# Patient Record
Sex: Male | Born: 1990 | Hispanic: Yes | Marital: Single | Smoking: Never smoker
Health system: Southern US, Community
[De-identification: ages and names within clinical notes are randomized; demographics above are authoritative.]

---
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
## 2013-02-19 ENCOUNTER — Emergency Department: Payer: Self-pay | Admitting: Unknown Physician Specialty

## 2015-05-16 ENCOUNTER — Emergency Department: Payer: Self-pay

## 2015-05-16 ENCOUNTER — Encounter: Payer: Self-pay | Admitting: Radiology

## 2015-05-16 ENCOUNTER — Emergency Department
Admission: EM | Admit: 2015-05-16 | Discharge: 2015-05-17 | Disposition: A | Discharge status: Home / Self Care | Payer: Self-pay | Attending: Emergency Medicine | Admitting: Emergency Medicine

## 2015-05-16 DIAGNOSIS — S63601A Unspecified sprain of right thumb, initial encounter: Secondary | ICD-10-CM | POA: Insufficient documentation

## 2015-05-16 DIAGNOSIS — S51012A Laceration without foreign body of left elbow, initial encounter: Secondary | ICD-10-CM | POA: Insufficient documentation

## 2015-05-16 DIAGNOSIS — S0990XA Unspecified injury of head, initial encounter: Secondary | ICD-10-CM | POA: Insufficient documentation

## 2015-05-16 DIAGNOSIS — T148XXA Other injury of unspecified body region, initial encounter: Secondary | ICD-10-CM

## 2015-05-16 DIAGNOSIS — Y92410 Unspecified street and highway as the place of occurrence of the external cause: Secondary | ICD-10-CM | POA: Insufficient documentation

## 2015-05-16 DIAGNOSIS — S61411A Laceration without foreign body of right hand, initial encounter: Secondary | ICD-10-CM | POA: Insufficient documentation

## 2015-05-16 DIAGNOSIS — Y9389 Activity, other specified: Secondary | ICD-10-CM | POA: Insufficient documentation

## 2015-05-16 DIAGNOSIS — T07XXXA Unspecified multiple injuries, initial encounter: Secondary | ICD-10-CM

## 2015-05-16 DIAGNOSIS — S53402A Unspecified sprain of left elbow, initial encounter: Secondary | ICD-10-CM | POA: Insufficient documentation

## 2015-05-16 DIAGNOSIS — Y998 Other external cause status: Secondary | ICD-10-CM | POA: Insufficient documentation

## 2015-05-16 DIAGNOSIS — IMO0002 Reserved for concepts with insufficient information to code with codable children: Secondary | ICD-10-CM

## 2015-05-16 DIAGNOSIS — S50812A Abrasion of left forearm, initial encounter: Secondary | ICD-10-CM | POA: Insufficient documentation

## 2015-05-16 LAB — COMPREHENSIVE METABOLIC PANEL
ALBUMIN: 4.7 g/dL (ref 3.5–5.0)
ALT: 48 U/L (ref 17–63)
ANION GAP: 3 — AB (ref 5–15)
AST: 47 U/L — ABNORMAL HIGH (ref 15–41)
Alkaline Phosphatase: 83 U/L (ref 38–126)
BUN: 20 mg/dL (ref 6–20)
CALCIUM: 8.9 mg/dL (ref 8.9–10.3)
CO2: 29 mmol/L (ref 22–32)
Chloride: 107 mmol/L (ref 101–111)
Creatinine, Ser: 1.2 mg/dL (ref 0.61–1.24)
GFR calc Af Amer: >60 mL/min (ref >60)
GFR calc non Af Amer: >60 mL/min (ref >60)
GLUCOSE: 127 mg/dL — AB (ref 65–99)
Potassium: 3.5 mmol/L (ref 3.5–5.1)
SODIUM: 139 mmol/L (ref 135–145)
TOTAL PROTEIN: 7.2 g/dL (ref 6.5–8.1)
Total Bilirubin: 0.5 mg/dL (ref 0.3–1.2)

## 2015-05-16 LAB — CBC WITH DIFFERENTIAL/PLATELET
Basophils Absolute: 0 10*3/uL (ref 0–0.1)
Basophils Relative: 0 %
EOS ABS: 0.3 10*3/uL (ref 0–0.7)
Eosinophils Relative: 3 %
HCT: 45.4 % (ref 40.0–52.0)
HEMOGLOBIN: 15.5 g/dL (ref 13.0–18.0)
LYMPHS ABS: 2.4 10*3/uL (ref 1.0–3.6)
LYMPHS PCT: 24 %
MCH: 30 pg (ref 26.0–34.0)
MCHC: 34.2 g/dL (ref 32.0–36.0)
MCV: 87.9 fL (ref 80.0–100.0)
MONOS PCT: 7 %
Monocytes Absolute: 0.7 10*3/uL (ref 0.2–1.0)
NEUTROS ABS: 6.5 10*3/uL (ref 1.4–6.5)
NEUTROS PCT: 66 %
PLATELETS: 228 10*3/uL (ref 150–440)
RBC: 5.16 MIL/uL (ref 4.40–5.90)
RDW: 13 % (ref 11.5–14.5)
WBC: 10 10*3/uL (ref 3.8–10.6)

## 2015-05-16 LAB — LIPASE, BLOOD: LIPASE: 29 U/L (ref 22–51)

## 2015-05-16 MED ORDER — MORPHINE SULFATE 4 MG/ML IJ SOLN
4.0000 mg | INTRAMUSCULAR | Status: AC
  Administered 2015-05-16: 4 mg via INTRAVENOUS

## 2015-05-16 MED ORDER — SODIUM CHLORIDE 0.9 % IV BOLUS (SEPSIS)
1000.0000 mL | Freq: Once | INTRAVENOUS | Status: AC
  Administered 2015-05-16: 1000 mL via INTRAVENOUS

## 2015-05-16 MED ORDER — LIDOCAINE-EPINEPHRINE (PF) 1 %-1:200000 IJ SOLN
INTRAMUSCULAR | Status: AC
  Administered 2015-05-16: 23:00:00
  Filled 2015-05-16: qty 30

## 2015-05-16 MED ORDER — ONDANSETRON HCL 4 MG/2ML IJ SOLN
INTRAMUSCULAR | Status: AC
  Filled 2015-05-16: qty 2

## 2015-05-16 MED ORDER — MORPHINE SULFATE 4 MG/ML IJ SOLN
INTRAMUSCULAR | Status: AC
  Filled 2015-05-16: qty 1

## 2015-05-16 MED ORDER — BACITRACIN ZINC 500 UNIT/GM EX OINT
TOPICAL_OINTMENT | CUTANEOUS | Status: AC
  Filled 2015-05-16: qty 1.8

## 2015-05-16 MED ORDER — ONDANSETRON HCL 4 MG/2ML IJ SOLN
4.0000 mg | Freq: Once | INTRAMUSCULAR | Status: AC
  Administered 2015-05-16: 4 mg via INTRAVENOUS

## 2015-05-16 MED ORDER — IOHEXOL 300 MG/ML  SOLN
125.0000 mL | Freq: Once | INTRAMUSCULAR | Status: AC | PRN
  Administered 2015-05-16: 125 mL via INTRAVENOUS
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
## 2015-05-16 NOTE — ED Notes (Signed)
Pt in CT.

## 2015-05-16 NOTE — Discharge Instructions (Signed)
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
## 2015-05-17 ENCOUNTER — Emergency Department: Payer: Self-pay

## 2015-05-17 MED ORDER — CEPHALEXIN 500 MG PO CAPS
ORAL_CAPSULE | ORAL | Status: AC
  Administered 2015-05-17: 500 mg via ORAL
  Filled 2015-05-17: qty 1

## 2015-05-17 MED ORDER — HYDROCODONE-ACETAMINOPHEN 5-325 MG PO TABS: 1.0000 | ORAL_TABLET | ORAL | Status: AC | PRN

## 2015-05-17 MED ORDER — IBUPROFEN 800 MG PO TABS: 800.0000 mg | ORAL_TABLET | Freq: Three times a day (TID) | ORAL | Status: AC | PRN

## 2015-05-17 MED ORDER — KETOROLAC TROMETHAMINE 30 MG/ML IJ SOLN
INTRAMUSCULAR | Status: AC
  Administered 2015-05-17: 30 mg via INTRAVENOUS
  Filled 2015-05-17: qty 1

## 2015-05-17 MED ORDER — CEPHALEXIN 500 MG PO CAPS: 500.0000 mg | ORAL_CAPSULE | Freq: Four times a day (QID) | ORAL | Status: AC

## 2015-05-17 MED ORDER — KETOROLAC TROMETHAMINE 30 MG/ML IJ SOLN
30.0000 mg | Freq: Once | INTRAMUSCULAR | Status: AC
  Administered 2015-05-17: 30 mg via INTRAVENOUS

## 2015-05-17 MED ORDER — CEPHALEXIN 500 MG PO CAPS
500.0000 mg | ORAL_CAPSULE | Freq: Once | ORAL | Status: AC
  Administered 2015-05-17: 500 mg via ORAL

## 2015-05-17 NOTE — ED Notes (Signed)
Left elbow laceration repaired by Dr. Pershing Proud,  Bacitracin and bandaid applied,  Pt c/o worsening and and wound continued to bleed soaking through the bandaid.   Dr Ladona Ridgel was notified and she went to bedside to re-eval.

## 2015-05-17 NOTE — ED Notes (Signed)
Pain is 14 per pt given po and iv meds

## 2015-05-17 NOTE — ED Provider Notes (Signed)
-----------------------------------------   2:46 AM on 05/17/2015 -----------------------------------------  Reevaluation patient's left elbow had gotten significantly swollen and he was having much increased pain. We decided to send him back and have an x-ray of the left elbow but it was negative for acute fracture. Patient is to continue wound care to his left arm and left elbow which significantly had a large amount of abrasion as well as a laceration. We will place the patient on antibiotics to make sure that none of these wounds can impacted as well as Jenel Lucks give him hydrocodone and ibuprofen to take at home for pain. Patient had a thumb spica placed to his right thumb which did not show fracture but had some pretty significant for small ligamentous injury. Patient will be sent to orthopedics in follow-up.  Ferdinand Lango, personally viewed and evaluated these images as part of my medical decision making.      Leona Carry, MD 05/17/15 [phone]
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
## 2023-05-19 ENCOUNTER — Ambulatory Visit
Admission: EM | Admit: 2023-05-19 | Discharge: 2023-05-19 | Disposition: A | Discharge status: Home / Self Care | Payer: Self-pay | Attending: Emergency Medicine | Admitting: Emergency Medicine

## 2023-05-19 DIAGNOSIS — N481 Balanitis: Secondary | ICD-10-CM

## 2023-05-19 MED ORDER — CLOTRIMAZOLE-BETAMETHASONE 1-0.05 % EX CREA: TOPICAL_CREAM | CUTANEOUS | 0 refills | Status: AC

## 2023-05-19 NOTE — ED Provider Notes (Signed)
[facility]    CSN: [id number] Arrival date & time: 05/19/23  1924      History   Chief Complaint Chief Complaint  Patient presents with   Penile Issue   SEXUALLY TRANSMITTED DISEASE    HPI Javier Rhodes is a 32 y.o. male.   32 year old male patient, Javier Rhodes, presents to urgent care for evaluation of penile issue.  Patient states he has had scabs and sores on his penis and has been using a body scrub brush when bathing and thinks he has irritated his penis.  Patient denies new sexual partner, wears condoms with sexual activity.  Pt is uncircumcised  The history is provided by the patient. No language interpreter was used.    History reviewed. No pertinent past medical history.  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 No pertinent surgical history.     Home Medications    Prior to Admission medications   Medication Sig Start Date End Date Taking? Authorizing Provider  clotrimazole-betamethasone (LOTRISONE) cream Apply to affected area 2 times daily x 1 week 05/19/23  Yes Conchetta Lamia, Para March, NP  HYDROcodone-acetaminophen (NORCO) 5-325 MG per tablet Take 1 tablet by mouth every 4 (four) hours as needed for moderate pain. 05/17/15   Leona Carry, MD  ibuprofen (ADVIL,MOTRIN) 800 MG tablet Take 1 tablet (800 mg total) by mouth every 8 (eight) hours as needed. 05/17/15   Leona Carry, MD    Family History History reviewed. No pertinent family history.  Social History Social History   Tobacco Use   Smoking status: Never   Smokeless tobacco: Never  Vaping Use   Vaping Use: Some days  Substance Use Topics   Alcohol use: Yes    Comment: Occ   Drug use: Never     Allergies   Patient has no known allergies.   Review of Systems Review of Systems  Constitutional:  Negative for fever.  Genitourinary:  Positive for dysuria, genital sores and penile pain. Negative for  difficulty urinating, penile discharge, penile swelling, scrotal swelling and testicular pain.  All other systems reviewed and are negative.    Physical Exam Triage Vital Signs ED Triage Vitals  Enc Vitals Group     BP 05/19/23 1928 124/84     Pulse Rate 05/19/23 1928 94     Resp 05/19/23 1928 16     Temp 05/19/23 1928 98.8 F (37.1 C)     Temp Source 05/19/23 1928 Oral     SpO2 05/19/23 1928 94 %     Weight 05/19/23 1928 (!) 310 lb (140.6 kg)     Height 05/19/23 1928 5\' 11"  (1.803 m)     Head Circumference --      Peak Flow --      Pain Score 05/19/23 1931 10     Pain Loc --      Pain Edu? --      Excl. in GC? --    No data found.  Updated Vital Signs BP 124/84 (BP Location: Right Arm)   Pulse 94   Temp 98.8 F (37.1 C) (Oral)   Resp 16   Ht 5\' 11"  (1.803 m)   Wt (!) 310 lb (140.6 kg)   SpO2 94%   BMI 43.24 kg/m   Visual Acuity Right Eye Distance:   Left Eye Distance:   Bilateral Distance:    Right Eye Near:   Left Eye Near:  Bilateral Near:     Physical Exam Vitals and nursing note reviewed. Exam conducted with a chaperone present.  Genitourinary:    Penis: Uncircumcised. Erythema and lesions present.      Testes: Normal.     Epididymis:     Right: Normal.     Left: Normal.     Comments: Under shaft of penis/foreskin,erythema noted,no purulent discharge  Jeremy,NP, evaluated pt and agrees with assessment/exam findings   Neurological:     General: No focal deficit present.     Mental Status: He is alert and oriented to person, place, and time.     GCS: GCS eye subscore is 4. GCS verbal subscore is 5. GCS motor subscore is 6.  Psychiatric:        Attention and Perception: Attention normal.        Mood and Affect: Mood normal.        Speech: Speech normal.      UC Treatments / Results  Labs (all labs ordered are listed, but only abnormal results are displayed) Labs Reviewed  CYTOLOGY, (ORAL, ANAL, URETHRAL) ANCILLARY ONLY     EKG   Radiology No results found.  Procedures Procedures (including critical care time)  Medications Ordered in UC Medications - No data to display  Initial Impression / Assessment and Plan / UC Course  I have reviewed the triage vital signs and the nursing notes.  Pertinent labs & imaging results that were available during my care of the patient were reviewed by me and considered in my medical decision making (see chart for details).     Ddx: Balanitis, yeast infection,phimosis,allergies,STI Final Clinical Impressions(s) / UC Diagnoses   Final diagnoses:  Balanitis     Discharge Instructions      Avoid sexual activity until results are known, wear condoms with all future sexual activity. Use lotrisone as directed, follow up with PCP,urologist if symptoms persist or worsen. Stop scrubbing the area, wash and retract foreskin daily.     ED Prescriptions     Medication Sig Dispense Auth. Provider   clotrimazole-betamethasone (LOTRISONE) cream Apply to affected area 2 times daily x 1 week 15 g Nyssa Sayegh, Para March, NP      PDMP not reviewed this encounter.   Clancy Gourd, NP 05/19/23 2043

## 2023-05-19 NOTE — ED Triage Notes (Signed)
Pt c/o scabs in penis,redness & pain x1 wk. Denies any discharge. Concerned about poss STD.

## 2023-05-19 NOTE — Discharge Instructions (Addendum)
Avoid sexual activity until results are known, wear condoms with all future sexual activity. Use lotrisone as directed, follow up with PCP,urologist if symptoms persist or worsen. Stop scrubbing the area, wash and retract foreskin daily.

## 2023-05-21 LAB — CYTOLOGY, (ORAL, ANAL, URETHRAL) ANCILLARY ONLY
Chlamydia: NEGATIVE
Comment: NEGATIVE
Comment: NEGATIVE
Comment: NORMAL
Neisseria Gonorrhea: NEGATIVE
Trichomonas: NEGATIVE

## 2023-05-29 ENCOUNTER — Ambulatory Visit
Admission: EM | Admit: 2023-05-29 | Discharge: 2023-05-29 | Disposition: A | Discharge status: Home / Self Care | Payer: Self-pay | Attending: Physician Assistant | Admitting: Physician Assistant

## 2023-05-29 DIAGNOSIS — E1165 Type 2 diabetes mellitus with hyperglycemia: Secondary | ICD-10-CM

## 2023-05-29 DIAGNOSIS — H538 Other visual disturbances: Secondary | ICD-10-CM

## 2023-05-29 DIAGNOSIS — R5383 Other fatigue: Secondary | ICD-10-CM

## 2023-05-29 DIAGNOSIS — R739 Hyperglycemia, unspecified: Secondary | ICD-10-CM

## 2023-05-29 DIAGNOSIS — M791 Myalgia, unspecified site: Secondary | ICD-10-CM

## 2023-05-29 DIAGNOSIS — R42 Dizziness and giddiness: Secondary | ICD-10-CM

## 2023-05-29 LAB — GLUCOSE, CAPILLARY: Glucose-Capillary: 430 mg/dL — ABNORMAL HIGH (ref 70–99)

## 2023-05-29 NOTE — ED Notes (Signed)
Patient is being discharged from the Urgent Care and sent to the Emergency Department via [facility] . Per provider Nehemiah Settle, patient is in need of higher level of care due to high sugar level with blurry vision. Patient is aware and verbalizes understanding of plan of care.  Vitals:   05/29/23 1345  BP: 135/85  Pulse: 95  Resp: 18  Temp: 99.3 F (37.4 C)  SpO2: 100%

## 2023-05-29 NOTE — ED Triage Notes (Signed)
Pt reports fatigue, visual changes and t headache x 3 days. Pt reports family history of diabetes,

## 2023-05-29 NOTE — Discharge Instructions (Addendum)
-  Your blood sugar today is 430.  As discussed normal blood sugar is 80-100.  There is is significantly elevated and you are very symptomatic.  You are definitely diabetic.  Please go to the ER for treatment.  You have been advised to follow up immediately in the emergency department for concerning signs.symptoms. If you declined EMS transport, please have a family member take you directly to the ED at this time. Do not delay. Based on concerns about condition, if you do not follow up in th e ED, you may risk poor outcomes including worsening of condition, delayed treatment and potentially life threatening issues. If you have declined to go to the ED at this time, you should call your PCP immediately to set up a follow up appointment.  Go to ED for red flag symptoms, including; fevers you cannot reduce with Tylenol/Motrin, severe headaches, vision changes, numbness/weakness in part of the body, lethargy, confusion, intractable vomiting, severe dehydration, chest pain, breathing difficulty, severe persistent abdominal or pelvic pain, signs of severe infection (increased redness, swelling of an area), feeling faint or passing out, dizziness, etc. You should especially go to the ED for sudden acute worsening of condition if you do not elect to go at this time.

## 2023-05-29 NOTE — ED Provider Notes (Signed)
[facility]    CSN: [id number] Arrival date & time: 05/29/23  1250      History   Chief Complaint Chief Complaint  Patient presents with   Generalized Body Aches   Eye Problem    HPI Javier Rhodes is a 32 y.o. male presenting for headaches, generalized body aches, fatigue, blurred vision, generalized weakness, dizziness, urinary frequency, increased thirst and decreased appetite over the past few days.  He presently is complaining about cramping in his hands and says it hurts to walk on his legs feel weak like he could fall.  He denies fever, cough, ingestion, sore throat, chest pain, palpations, shortness of breath, nausea/vomiting or diarrhea.  He says he has never been evaluated by a provider for a general physical exam.  He does not have a PCP.  He was seen here in the 2 weeks for yeast balanitis.  Strong family history of diabetes.  HPI  History reviewed. No pertinent past medical history.  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 No pertinent surgical history.     Home Medications    Prior to Admission medications   Medication Sig Start Date End Date Taking? Authorizing Provider  clotrimazole-betamethasone (LOTRISONE) cream Apply to affected area 2 times daily x 1 week 05/19/23   Defelice, Para March, NP  HYDROcodone-acetaminophen (NORCO) 5-325 MG per tablet Take 1 tablet by mouth every 4 (four) hours as needed for moderate pain. 05/17/15   Leona Carry, MD  ibuprofen (ADVIL,MOTRIN) 800 MG tablet Take 1 tablet (800 mg total) by mouth every 8 (eight) hours as needed. 05/17/15   Leona Carry, MD    Family History No family history on file.  Social History Social History   Tobacco Use   Smoking status: Never   Smokeless tobacco: Never  Vaping Use   Vaping Use: Some days  Substance Use Topics   Alcohol use: Yes    Comment: Occ   Drug use: Never     Allergies   Patient has no known  allergies.   Review of Systems Review of Systems  Constitutional:  Positive for fatigue.  HENT:  Negative for congestion, rhinorrhea and sore throat.   Eyes:  Positive for visual disturbance.  Respiratory:  Negative for shortness of breath.   Cardiovascular:  Negative for chest pain.  Gastrointestinal:  Negative for abdominal pain, diarrhea, nausea and vomiting.  Endocrine: Positive for polydipsia and polyuria.  Genitourinary:  Positive for frequency. Negative for difficulty urinating and dysuria.  Musculoskeletal:  Positive for myalgias.  Neurological:  Positive for dizziness, weakness, light-headedness and headaches. Negative for syncope and numbness.     Physical Exam Triage Vital Signs ED Triage Vitals [05/29/23 1345]  Enc Vitals Group     BP 135/85     Pulse Rate 95     Resp 18     Temp 99.3 F (37.4 C)     Temp Source Oral     SpO2 100 %     Weight      Height      Head Circumference      Peak Flow      Pain Score      Pain Loc      Pain Edu?      Excl. in GC?    No data found.  Updated Vital Signs BP 135/85 (BP Location: Left Arm)   Pulse 95   Temp 99.3 F (37.4  C) (Oral)   Resp 18   SpO2 100%     Physical Exam Vitals and nursing note reviewed.  Constitutional:      General: He is not in acute distress.    Appearance: Normal appearance. He is well-developed. He is obese.  HENT:     Head: Normocephalic and atraumatic.  Eyes:     General: No scleral icterus.    Extraocular Movements: Extraocular movements intact.     Conjunctiva/sclera: Conjunctivae normal.     Pupils: Pupils are equal, round, and reactive to light.  Cardiovascular:     Rate and Rhythm: Normal rate and regular rhythm.     Heart sounds: No murmur heard. Pulmonary:     Effort: Pulmonary effort is normal. No respiratory distress.     Breath sounds: Normal breath sounds.  Abdominal:     Palpations: Abdomen is soft.     Tenderness: There is no abdominal tenderness.   Musculoskeletal:     Cervical back: Neck supple.  Skin:    General: Skin is warm and dry.     Capillary Refill: Capillary refill takes less than 2 seconds.  Neurological:     General: No focal deficit present.     Mental Status: He is alert and oriented to person, place, and time. Mental status is at baseline.     Motor: No weakness.  Psychiatric:        Mood and Affect: Mood normal.        Behavior: Behavior normal.      UC Treatments / Results  Labs (all labs ordered are listed, but only abnormal results are displayed) Labs Reviewed  GLUCOSE, CAPILLARY - Abnormal; Notable for the following components:      Result Value   Glucose-Capillary 430 (*)    All other components within normal limits  CBG MONITORING, ED    EKG   Radiology No results found.  Procedures Procedures (including critical care time)  Medications Ordered in UC Medications - No data to display  Initial Impression / Assessment and Plan / UC Course  I have reviewed the triage vital signs and the nursing notes.  Pertinent labs & imaging results that were available during my care of the patient were reviewed by me and considered in my medical decision making (see chart for details).   32 year old male presents for fatigue, generalized bodyaches, generalized weakness, blurred vision, urinary frequency, increased thirst, decreased appetite and dizziness for the past few days.  Strong family history of diabetes.  Vitals are all normal and stable.  Patient is overall well-appearing.  Exam benign.  Fingerstick glucose is 430.  Patient has no known previous history of diabetes but reports he does not have a PCP and has never had any routine labs checked.  Advised patient that he is very symptomatic and his sugar is very high.  Advised immediate follow-up in the emergency department to reduce his blood sugar, assess lab work and acquire appropriate referrals to specialist.  Patient understanding and  agreeable.  Proceeding to [facility] ED [place].  Leaving in stable condition.  Patient is driving himself.  He does not want to go via EMS.  Chronic undiagnosed underlying illness. Condition with threat to bodily harm.   Final Clinical Impressions(s) / UC Diagnoses   Final diagnoses:  Type 2 diabetes mellitus with hyperglycemia, without long-term current use of insulin (HCC)  Dizziness  Other fatigue  Myalgia  Blurred vision     Discharge Instructions      -Your  blood sugar today is 430.  As discussed normal blood sugar is 80-100.  There is is significantly elevated and you are very symptomatic.  You are definitely diabetic.  Please go to the ER for treatment.  You have been advised to follow up immediately in the emergency department for concerning signs.symptoms. If you declined EMS transport, please have a family member take you directly to the ED at this time. Do not delay. Based on concerns about condition, if you do not follow up in th e ED, you may risk poor outcomes including worsening of condition, delayed treatment and potentially life threatening issues. If you have declined to go to the ED at this time, you should call your PCP immediately to set up a follow up appointment.  Go to ED for red flag symptoms, including; fevers you cannot reduce with Tylenol/Motrin, severe headaches, vision changes, numbness/weakness in part of the body, lethargy, confusion, intractable vomiting, severe dehydration, chest pain, breathing difficulty, severe persistent abdominal or pelvic pain, signs of severe infection (increased redness, swelling of an area), feeling faint or passing out, dizziness, etc. You should especially go to the ED for sudden acute worsening of condition if you do not elect to go at this time.      ED Prescriptions   None    PDMP not reviewed this encounter.   Shirlee Latch, PA-C 05/29/23 [phone]

## 2023-05-29 NOTE — ED Triage Notes (Signed)
Also, patient is drinking a lot more . Pt denies working outside.
